# Patient Record
Sex: Female | Born: 1990 | Race: White | Hispanic: Yes | Marital: Married | Smoking: Never smoker
Health system: Southern US, Community
[De-identification: ages and names within clinical notes are randomized; demographics above are authoritative.]

## PROBLEM LIST (undated history)

## (undated) HISTORY — PX: EYE SURGERY: SHX253

---

## 2019-06-27 ENCOUNTER — Encounter
Admission: EM | Disposition: A | Discharge status: Home / Self Care | Payer: Self-pay | Source: Home / Self Care | Attending: Student in an Organized Health Care Education/Training Program

## 2019-06-27 ENCOUNTER — Emergency Department: Payer: Self-pay | Admitting: Anesthesiology

## 2019-06-27 ENCOUNTER — Observation Stay
Admission: EM | Admit: 2019-06-27 | Discharge: 2019-06-28 | Disposition: A | Discharge status: Home / Self Care | Payer: Self-pay | Attending: Obstetrics and Gynecology | Admitting: Obstetrics and Gynecology

## 2019-06-27 ENCOUNTER — Inpatient Hospital Stay: Admit: 2019-06-27 | Payer: Self-pay | Admitting: Obstetrics and Gynecology

## 2019-06-27 ENCOUNTER — Emergency Department: Payer: Self-pay

## 2019-06-27 ENCOUNTER — Other Ambulatory Visit: Payer: Self-pay

## 2019-06-27 DIAGNOSIS — D270 Benign neoplasm of right ovary: Secondary | ICD-10-CM | POA: Insufficient documentation

## 2019-06-27 DIAGNOSIS — N838 Other noninflammatory disorders of ovary, fallopian tube and broad ligament: Secondary | ICD-10-CM | POA: Insufficient documentation

## 2019-06-27 DIAGNOSIS — R102 Pelvic and perineal pain: Secondary | ICD-10-CM

## 2019-06-27 DIAGNOSIS — Z20822 Contact with and (suspected) exposure to covid-19: Secondary | ICD-10-CM | POA: Insufficient documentation

## 2019-06-27 DIAGNOSIS — N83519 Torsion of ovary and ovarian pedicle, unspecified side: Secondary | ICD-10-CM

## 2019-06-27 DIAGNOSIS — R1032 Left lower quadrant pain: Secondary | ICD-10-CM

## 2019-06-27 DIAGNOSIS — Z9889 Other specified postprocedural states: Secondary | ICD-10-CM

## 2019-06-27 DIAGNOSIS — N83292 Other ovarian cyst, left side: Secondary | ICD-10-CM | POA: Insufficient documentation

## 2019-06-27 DIAGNOSIS — R112 Nausea with vomiting, unspecified: Secondary | ICD-10-CM

## 2019-06-27 DIAGNOSIS — N83512 Torsion of left ovary and ovarian pedicle: Principal | ICD-10-CM | POA: Insufficient documentation

## 2019-06-27 HISTORY — PX: LAPAROSCOPIC OVARIAN CYSTECTOMY: SHX6248

## 2019-06-27 HISTORY — PX: LAPAROSCOPY: SHX197

## 2019-06-27 LAB — URINALYSIS, COMPLETE (UACMP) WITH MICROSCOPIC
Bacteria, UA: NONE SEEN
Bilirubin Urine: NEGATIVE
Glucose, UA: NEGATIVE mg/dL
Ketones, ur: NEGATIVE mg/dL
Nitrite: NEGATIVE
Protein, ur: NEGATIVE mg/dL
Specific Gravity, Urine: 1.011 (ref 1.005–1.030)
pH: 7 (ref 5.0–8.0)

## 2019-06-27 LAB — BASIC METABOLIC PANEL
Anion gap: 10 (ref 5–15)
BUN: 10 mg/dL (ref 6–20)
CO2: 29 mmol/L (ref 22–32)
Calcium: 9.1 mg/dL (ref 8.9–10.3)
Chloride: 100 mmol/L (ref 98–111)
Creatinine, Ser: 0.55 mg/dL (ref 0.44–1.00)
GFR calc Af Amer: >60 mL/min (ref >60)
GFR calc non Af Amer: >60 mL/min (ref >60)
Glucose, Bld: 121 mg/dL — ABNORMAL HIGH (ref 70–99)
Potassium: 3.4 mmol/L — ABNORMAL LOW (ref 3.5–5.1)
Sodium: 139 mmol/L (ref 135–145)

## 2019-06-27 LAB — CBC
HCT: 40.2 % (ref 36.0–46.0)
Hemoglobin: 13.9 g/dL (ref 12.0–15.0)
MCH: 30.4 pg (ref 26.0–34.0)
MCHC: 34.6 g/dL (ref 30.0–36.0)
MCV: 88 fL (ref 80.0–100.0)
Platelets: 309 10*3/uL (ref 150–400)
RBC: 4.57 MIL/uL (ref 3.87–5.11)
RDW: 12 % (ref 11.5–15.5)
WBC: 8.8 10*3/uL (ref 4.0–10.5)
nRBC: 0 % (ref 0.0–0.2)

## 2019-06-27 LAB — POC SARS CORONAVIRUS 2 AG: SARS Coronavirus 2 Ag: NEGATIVE

## 2019-06-27 LAB — RESPIRATORY PANEL BY RT PCR (FLU A&B, COVID)
Influenza A by PCR: NEGATIVE
Influenza B by PCR: NEGATIVE
SARS Coronavirus 2 by RT PCR: NEGATIVE

## 2019-06-27 LAB — POCT PREGNANCY, URINE: Preg Test, Ur: NEGATIVE

## 2019-06-27 LAB — PROTIME-INR
INR: 0.9 (ref 0.8–1.2)
Prothrombin Time: 12.4 seconds (ref 11.4–15.2)

## 2019-06-27 SURGERY — LAPAROSCOPY, DIAGNOSTIC
Anesthesia: General

## 2019-06-27 MED ORDER — ONDANSETRON HCL 4 MG/2ML IJ SOLN
INTRAMUSCULAR | Status: DC | PRN
  Administered 2019-06-27: 4 mg via INTRAVENOUS

## 2019-06-27 MED ORDER — KETOROLAC TROMETHAMINE 30 MG/ML IJ SOLN
30.0000 mg | Freq: Once | INTRAMUSCULAR | Status: AC
  Administered 2019-06-27: 30 mg via INTRAMUSCULAR
  Filled 2019-06-27: qty 1

## 2019-06-27 MED ORDER — BUPIVACAINE HCL 0.5 % IJ SOLN
INTRAMUSCULAR | Status: DC | PRN
  Administered 2019-06-27: 8 mL

## 2019-06-27 MED ORDER — PROPOFOL 10 MG/ML IV BOLUS
INTRAVENOUS | Status: DC | PRN
  Administered 2019-06-27: 150 mg via INTRAVENOUS

## 2019-06-27 MED ORDER — PROMETHAZINE HCL 25 MG/ML IJ SOLN: 6.2500 mg | INTRAMUSCULAR | Status: DC | PRN

## 2019-06-27 MED ORDER — ROCURONIUM BROMIDE 100 MG/10ML IV SOLN
INTRAVENOUS | Status: DC | PRN
  Administered 2019-06-27: 15 mg via INTRAVENOUS
  Administered 2019-06-27: 40 mg via INTRAVENOUS

## 2019-06-27 MED ORDER — DEXMEDETOMIDINE HCL 200 MCG/2ML IV SOLN
INTRAVENOUS | Status: DC | PRN
  Administered 2019-06-27: 12 ug via INTRAVENOUS

## 2019-06-27 MED ORDER — DEXAMETHASONE SODIUM PHOSPHATE 10 MG/ML IJ SOLN
INTRAMUSCULAR | Status: DC | PRN
  Administered 2019-06-27: 10 mg via INTRAVENOUS

## 2019-06-27 MED ORDER — FENTANYL CITRATE (PF) 100 MCG/2ML IJ SOLN
25.0000 ug | INTRAMUSCULAR | Status: DC | PRN
  Administered 2019-06-27: 25 ug via INTRAVENOUS

## 2019-06-27 MED ORDER — OXYCODONE HCL 5 MG/5ML PO SOLN: 5.0000 mg | Freq: Once | ORAL | Status: DC | PRN

## 2019-06-27 MED ORDER — KETOROLAC TROMETHAMINE 30 MG/ML IJ SOLN
INTRAMUSCULAR | Status: DC | PRN
  Administered 2019-06-27: 30 mg via INTRAVENOUS

## 2019-06-27 MED ORDER — ONDANSETRON 4 MG PO TBDP: 4.0000 mg | ORAL_TABLET | Freq: Four times a day (QID) | ORAL | Status: DC | PRN

## 2019-06-27 MED ORDER — OXYCODONE HCL 5 MG PO TABS: 5.0000 mg | ORAL_TABLET | Freq: Once | ORAL | Status: DC | PRN

## 2019-06-27 MED ORDER — SUCCINYLCHOLINE CHLORIDE 20 MG/ML IJ SOLN
INTRAMUSCULAR | Status: DC | PRN
  Administered 2019-06-27: 160 mg via INTRAVENOUS

## 2019-06-27 MED ORDER — LACTATED RINGERS IV SOLN: INTRAVENOUS | Status: DC

## 2019-06-27 MED ORDER — FENTANYL CITRATE (PF) 100 MCG/2ML IJ SOLN
INTRAMUSCULAR | Status: DC | PRN
  Administered 2019-06-27 (×2): 50 ug via INTRAVENOUS

## 2019-06-27 MED ORDER — OXYCODONE-ACETAMINOPHEN 5-325 MG PO TABS
1.0000 | ORAL_TABLET | ORAL | Status: DC | PRN
  Administered 2019-06-27 – 2019-06-28 (×2): 1 via ORAL
  Filled 2019-06-27 (×2): qty 1

## 2019-06-27 MED ORDER — MIDAZOLAM HCL 2 MG/2ML IJ SOLN
INTRAMUSCULAR | Status: DC | PRN
  Administered 2019-06-27: 2 mg via INTRAVENOUS

## 2019-06-27 MED ORDER — PHENYLEPHRINE HCL (PRESSORS) 10 MG/ML IV SOLN
INTRAVENOUS | Status: DC | PRN
  Administered 2019-06-27 (×2): 100 ug via INTRAVENOUS

## 2019-06-27 MED ORDER — ACETAMINOPHEN 10 MG/ML IV SOLN
INTRAVENOUS | Status: AC
  Filled 2019-06-27: qty 100

## 2019-06-27 MED ORDER — KETOROLAC TROMETHAMINE 30 MG/ML IJ SOLN: 30.0000 mg | Freq: Three times a day (TID) | INTRAMUSCULAR | Status: DC | PRN

## 2019-06-27 MED ORDER — FENTANYL CITRATE (PF) 100 MCG/2ML IJ SOLN
INTRAMUSCULAR | Status: AC
  Administered 2019-06-27: 25 ug via INTRAVENOUS
  Filled 2019-06-27: qty 2

## 2019-06-27 MED ORDER — HYDROCODONE-ACETAMINOPHEN 5-325 MG PO TABS
1.0000 | ORAL_TABLET | Freq: Once | ORAL | Status: AC
  Administered 2019-06-27: 1 via ORAL
  Filled 2019-06-27: qty 1

## 2019-06-27 MED ORDER — SUGAMMADEX SODIUM 200 MG/2ML IV SOLN
INTRAVENOUS | Status: DC | PRN
  Administered 2019-06-27: 200 mg via INTRAVENOUS

## 2019-06-27 MED ORDER — MIDAZOLAM HCL 2 MG/2ML IJ SOLN
INTRAMUSCULAR | Status: AC
  Filled 2019-06-27: qty 2

## 2019-06-27 MED ORDER — LIDOCAINE HCL (CARDIAC) PF 100 MG/5ML IV SOSY
PREFILLED_SYRINGE | INTRAVENOUS | Status: DC | PRN
  Administered 2019-06-27: 100 mg via INTRAVENOUS

## 2019-06-27 MED ORDER — FENTANYL CITRATE (PF) 100 MCG/2ML IJ SOLN
INTRAMUSCULAR | Status: AC
  Filled 2019-06-27: qty 2

## 2019-06-27 MED ORDER — LACTATED RINGERS IV SOLN: INTRAVENOUS | Status: DC | PRN

## 2019-06-27 SURGICAL SUPPLY — 42 items
BAG URINE DRAIN 2000ML AR STRL (UROLOGICAL SUPPLIES) ×3 IMPLANT
BLADE SURG SZ11 CARB STEEL (BLADE) ×3 IMPLANT
CANISTER SUCT 1200ML W/VALVE (MISCELLANEOUS) ×3 IMPLANT
CATH ROBINSON RED A/P 16FR (CATHETERS) ×3 IMPLANT
CHLORAPREP W/TINT 26 (MISCELLANEOUS) ×3 IMPLANT
CLOSURE WOUND 1/4X4 (GAUZE/BANDAGES/DRESSINGS) ×1
COVER WAND RF STERILE (DRAPES) IMPLANT
DRSG TEGADERM 2-3/8X2-3/4 SM (GAUZE/BANDAGES/DRESSINGS) ×3 IMPLANT
GLOVE BIO SURGEON STRL SZ8 (GLOVE) ×6 IMPLANT
GOWN STRL REUS W/ TWL LRG LVL3 (GOWN DISPOSABLE) ×2 IMPLANT
GOWN STRL REUS W/ TWL XL LVL3 (GOWN DISPOSABLE) ×1 IMPLANT
GOWN STRL REUS W/TWL LRG LVL3 (GOWN DISPOSABLE) ×4
GOWN STRL REUS W/TWL XL LVL3 (GOWN DISPOSABLE) ×2
GRASPER SUT TROCAR 14GX15 (MISCELLANEOUS) IMPLANT
IRRIGATION STRYKERFLOW (MISCELLANEOUS) ×1 IMPLANT
IRRIGATOR STRYKERFLOW (MISCELLANEOUS) ×3
IV NS 1000ML (IV SOLUTION)
IV NS 1000ML BAXH (IV SOLUTION) IMPLANT
KIT PINK PAD W/HEAD ARE REST (MISCELLANEOUS) ×3
KIT PINK PAD W/HEAD ARM REST (MISCELLANEOUS) ×1 IMPLANT
KIT TURNOVER CYSTO (KITS) ×3 IMPLANT
KITTNER LAPARASCOPIC 5X40 (MISCELLANEOUS) ×3 IMPLANT
LABEL OR SOLS (LABEL) ×3 IMPLANT
NS IRRIG 500ML POUR BTL (IV SOLUTION) ×3 IMPLANT
PACK GYN LAPAROSCOPIC (MISCELLANEOUS) ×3 IMPLANT
PAD OB MATERNITY 4.3X12.25 (PERSONAL CARE ITEMS) ×3 IMPLANT
PAD PREP 24X41 OB/GYN DISP (PERSONAL CARE ITEMS) ×3 IMPLANT
POUCH SPECIMEN RETRIEVAL 10MM (ENDOMECHANICALS) ×3 IMPLANT
SCISSORS METZENBAUM CVD 33 (INSTRUMENTS) ×3 IMPLANT
SET TUBE SMOKE EVAC HIGH FLOW (TUBING) ×3 IMPLANT
SHEARS HARMONIC ACE PLUS 36CM (ENDOMECHANICALS) ×3 IMPLANT
SLEEVE ENDOPATH XCEL 5M (ENDOMECHANICALS) ×3 IMPLANT
SPONGE GAUZE 2X2 8PLY STER LF (GAUZE/BANDAGES/DRESSINGS) ×1
SPONGE GAUZE 2X2 8PLY STRL LF (GAUZE/BANDAGES/DRESSINGS) ×2 IMPLANT
STRIP CLOSURE SKIN 1/4X4 (GAUZE/BANDAGES/DRESSINGS) ×2 IMPLANT
SUT VIC AB 0 CT1 36 (SUTURE) ×3 IMPLANT
SUT VIC AB 2-0 UR6 27 (SUTURE) ×3 IMPLANT
SUT VIC AB 4-0 SH 27 (SUTURE) ×2
SUT VIC AB 4-0 SH 27XANBCTRL (SUTURE) ×1 IMPLANT
SWABSTK COMLB BENZOIN TINCTURE (MISCELLANEOUS) ×3 IMPLANT
TROCAR ENDO BLADELESS 11MM (ENDOMECHANICALS) ×3 IMPLANT
TROCAR XCEL NON-BLD 5MMX100MML (ENDOMECHANICALS) ×3 IMPLANT

## 2019-06-27 NOTE — Plan of Care (Signed)
Transferred to Room 343 from PACU;Bedside Report Received from PACU RN. Video Interpreter used for all Assessment, Nursing Hx, High Fall Risk and Prevention and all Education. Interpreter: Caren Griffins: # [id number]

## 2019-06-27 NOTE — Consult Note (Signed)
Consult History and Physical   SERVICE: Gynecology unassigned patient  With consult from ED NP Webb Silversmith   Patient Name: Terri Brooks [facility] Patient MRN:   [id number]  CC:  Unassigned   HPI: [name] is a 29 y.o. No obstetric history on file. with 2 hour history of LLQ / Pelvic pain that came on suddenly . Go  Not on contraception . Pain 10+/ 10  No prior h/o similar  CT scan and U/S show enlarged simple bilateral ovarian cyst . Possible torsion on left    Review of Systems: positives in bold GEN:   fevers, chills, weight changes, appetite changes, fatigue, night sweats HEENT:  HA, vision changes, hearing loss, congestion, rhinorrhea, sinus pressure, dysphagia CV:   CP, palpitations PULM:  SOB, cough GI:  abd pain, N/V/D/C GU:  dysuria, urgency, frequency, + left pelvic pain  MSK:  arthralgias, myalgias, back pain, swelling SKIN:  rashes, color changes, pallor NEURO:  numbness, weakness, tingling, seizures, dizziness, tremors PSYCH:  depression, anxiety, behavioral problems, confusion  HEME/LYMPH:  easy bruising or bleeding ENDO:  heat/cold intolerance  Past Obstetrical History: OB History   No obstetric history on file.     Past Gynecologic History: Patient's last menstrual period was 06/08/2019.   Past Medical History: History reviewed. No pertinent past medical history. No CVDS , No Pulm ds, No PID / STD   Past Surgical History:   Past Surgical History:  Procedure Laterality Date  . EYE SURGERY      Family History:  No Gyn Cancer  Social History:  No tobacco, no etoh   Home Medications:  Medications reconciled in EPIC  No current facility-administered medications on file prior to encounter.   No current outpatient medications on file prior to encounter.    Allergies:  No Known Allergies  Physical Exam:  Temp:  [98.1 F (36.7 C)] 98.1 F (36.7 C) (02/20 1126) Pulse Rate:  [71-74] 71 (02/20 1551) Resp:  [16-18] 16 (02/20  1551) BP: (158-164)/(96-97) 158/96 (02/20 1551) SpO2:  [97 %-98 %] 98 % (02/20 1551) Weight:  [81.6 kg] 81.6 kg (02/20 1126)   General Appearance:  Well developed, well nourished, no acute distress, alert and oriented x3 HEENT:  Normocephalic atraumatic, extraocular movements intact, moist mucous membranes Cardiovascular:  Normal S1/S2, regular rate and rhythm, no murmurs Pulmonary:  clear to auscultation, no wheezes, rales or rhonchi, symmetric air entry, good air exchange Abdomen:  Bowel sounds present, soft, 1+/ 4 LLQ TTP  No rebound  Extremities:  Full range of motion, no pedal edema, 2+ distal pulses, no tenderness Skin:  normal coloration and turgor, no rashes, no suspicious skin lesions noted  Neurologic:  Cranial nerves 2-12 grossly intact, normal muscle tone, strength 5/5 all four extremities Psychiatric:  Normal mood and affect, appropriate, no AH/VH Pelvic:  Deferred   Labs/Studies:  Neg HCG  CBC and Coags:  Lab Results  Component Value Date   WBC 8.8 06/27/2019   HGB 13.9 06/27/2019   HCT 40.2 06/27/2019   MCV 88.0 06/27/2019   PLT 309 06/27/2019   INR 0.9 06/27/2019   CMP:  Lab Results  Component Value Date   NA 139 06/27/2019   K 3.4 (L) 06/27/2019   CL 100 06/27/2019   CO2 29 06/27/2019   BUN 10 06/27/2019   CREATININE 0.55 06/27/2019    Other Imaging: CT Renal Stone Study  Result Date: 06/27/2019 CLINICAL DATA:  Left lower quadrant abdominal pain. Nausea and vomiting. EXAM: CT ABDOMEN  AND PELVIS WITHOUT CONTRAST TECHNIQUE: Multidetector CT imaging of the abdomen and pelvis was performed following the standard protocol without IV contrast. COMPARISON:  None. FINDINGS: Lower chest:  Calcified granuloma at the right middle lobe. Hepatobiliary: Hepatic steatosis with subtle fat sparing at the gallbladder fossa.No evidence of biliary obstruction or stone. Pancreas: Unremarkable. Spleen: Unremarkable. Adrenals/Urinary Tract: Negative adrenals. No hydronephrosis or  stone. Unremarkable bladder. Stomach/Bowel:  No obstruction. No appendicitis. Vascular/Lymphatic: No acute vascular abnormality. No mass or adenopathy. Reproductive:Large appearance of both ovaries, but greater on the left with a low-density appearance and measuring 7.2 cm. There is septated or multiple cysts on the left and a 10 cm by 13 cm cyst on the right. The vascular pedicle on the left appears somewhat redundant, findings overall worrisome for torsion. Other: No ascites or pneumoperitoneum. Musculoskeletal: No acute abnormalities. These results were called by telephone at the time of interpretation on 06/27/2019 at 1:54 pm to provider [facility] , who verbally acknowledged these results. IMPRESSION: 1. Low-density thickening of the left ovary with distorted vascular pedicle and septated or multiple cysts/. Recommend pelvic ultrasound with Doppler to evaluate for torsion, the leading concern. 2. 13 cm right ovarian cyst that also needs sonographic assessment. 3. Paddock steatosis. Electronically Signed   By: Monte Fantasia M.D.   On: 06/27/2019 13:56   US PELVIC COMPLETE W TRANSVAGINAL AND TORSION R/O  Result Date: 06/27/2019 CLINICAL DATA:  Pelvic pain, concern for ovarian torsion. LMP 06/08/2019. EXAM: TRANSABDOMINAL AND TRANSVAGINAL ULTRASOUND OF PELVIS DOPPLER ULTRASOUND OF OVARIES TECHNIQUE: Both transabdominal and transvaginal ultrasound examinations of the pelvis were performed. Transabdominal technique was performed for global imaging of the pelvis including uterus, ovaries, adnexal regions, and pelvic cul-de-sac. It was necessary to proceed with endovaginal exam following the transabdominal exam to visualize the ovaries. Color and duplex Doppler ultrasound was utilized to evaluate blood flow to the ovaries. COMPARISON:  CT abdomen pelvis performed the same day FINDINGS: Uterus Measurements: 7.1 x 3.5 x 4.7 cm = volume: 61 mL. No fibroids or other mass visualized. Endometrium Thickness: 9 mm  which is likely normal given the patient's phase of the menstrual cycle. No focal abnormality visualized. Right ovary Measurements: 4.1 x 3.1 x 3.8 cm = volume: 25 mL. A large simple cyst measures 12.8 x 9.7 x 10.5 cm. Left ovary Measurements: 6.9 x 4.6 x 5.8 cm = volume: 96 mL. Two large adjacent cysts in total measure 13.3 x 7.1 x 6.0 cm. The central stroma of the ovary is hyperechoic and there are peripherally displaced follicles. Pulsed Doppler evaluation of both ovaries demonstrates normal low-resistance arterial waveforms. Normal venous waveforms are seen in the right ovary. Other findings No abnormal free fluid. IMPRESSION: Enlarged left ovary with hyperechoic central stroma is suspicious for ovarian torsion. These results were called by telephone at the time of interpretation on 06/27/2019 at 4:19 pm to provider Webb Silversmith NP, who verbally acknowledged these results. Electronically Signed   By: Zerita Boers M.D.   On: 06/27/2019 16:21     Assessment / Plan:   MARIELY CRISTELA Tullier is a 29 y.o. with symptomatic bilateral  ovarian cyst . 2 left ( simple ) and one right ( also Simple). Possible torsion on the left   Via the translator I have spelled out the need for surgery to possible de torse the left ovary . Ovarian cystectomies / cystotomies and then placement on OCPs to prevent redevelopment of cysts The risk of not doing surgery with torsion resulting in continued  pain and loss of left ovary   I have spelled out the risks of the surgery and the possibility of loss of either ovary while performing surgery .  pt's husband has also been counseled . They are in agreement and consents have been signed 75 minutes in patient care

## 2019-06-27 NOTE — Anesthesia Preprocedure Evaluation (Addendum)
Anesthesia Evaluation  Patient identified by MRN, date of birth, ID band Patient awake    Reviewed: Allergy & Precautions, H&P , NPO status , Patient's Chart, lab work & pertinent test results  History of Anesthesia Complications Negative for: history of anesthetic complications  Airway Mallampati: III  TM Distance: <3 FB Neck ROM: full    Dental  (+) Chipped, Poor Dentition   Pulmonary shortness of breath,           Cardiovascular (-) angina(-) Past MI negative cardio ROS       Neuro/Psych negative neurological ROS  negative psych ROS   GI/Hepatic negative GI ROS, Neg liver ROS, neg GERD  ,  Endo/Other  negative endocrine ROS  Renal/GU      Musculoskeletal   Abdominal   Peds  Hematology negative hematology ROS (+)   Anesthesia Other Findings History reviewed. No pertinent past medical history.  Past Surgical History: No date: EYE SURGERY  BMI    Body Mass Index: 32.92 kg/m      Reproductive/Obstetrics negative OB ROS                            Anesthesia Physical Anesthesia Plan  ASA: II  Anesthesia Plan: General ETT   Post-op Pain Management:    Induction: Intravenous  PONV Risk Score and Plan: Ondansetron, Dexamethasone, Midazolam and Treatment may vary due to age or medical condition  Airway Management Planned: Oral ETT  Additional Equipment:   Intra-op Plan:   Post-operative Plan: Extubation in OR  Informed Consent: I have reviewed the patients History and Physical, chart, labs and discussed the procedure including the risks, benefits and alternatives for the proposed anesthesia with the patient or authorized representative who has indicated his/her understanding and acceptance.     Dental Advisory Given  Plan Discussed with: Anesthesiologist, CRNA and Surgeon  Anesthesia Plan Comments: (History and consent via interpreter   Patient consented for risks  of anesthesia including but not limited to:  - adverse reactions to medications - damage to teeth, lips or other oral mucosa - sore throat or hoarseness - Damage to heart, brain, lungs or loss of life  Patient voiced understanding.)        Anesthesia Quick Evaluation

## 2019-06-27 NOTE — ED Provider Notes (Addendum)
[facility] Emergency Department Provider Note ____________________________________________  Time seen: 1235  I have reviewed the triage vital signs and the nursing notes.  HISTORY  Chief Complaint  Flank Pain   HPI Terri Brooks is a 29 y.o. female presents to the clinic today with c/o left lower quadrant pain. She reports the pain started this morning. The pain is constant but has gotten worse throughout the day. She is unable to describe the pain but reports the pain makes her have the urge to urinate. The pain does radiate into her groin. She has had some nausea and vomiting today. She denies constipation, diarrhea or blood in her stool. She denies urinary or vaginal complaints. Her LMP was the end of January into the beginning of February. She denies rash, fever, chills or body aches. She has not taken anything OTC for this. She has no history of kidney stones, no previous UTI's in the past.  History reviewed. No pertinent past medical history.  There are no problems to display for this patient.   Past Surgical History:  Procedure Laterality Date  . EYE SURGERY      Prior to Admission medications   Not on File    Allergies Patient has no known allergies.  History reviewed. No pertinent family history.  Social History Social History   Tobacco Use  . Smoking status: Never Smoker  Substance Use Topics  . Alcohol use: Not Currently  . Drug use: Not on file    Review of Systems  Constitutional: Negative for fever, chills or body aches. Cardiovascular: Negative for chest pain or chest tightness. Respiratory: Negative for cough or shortness of breath. Gastrointestinal: Positive for abdominal pain, nausea, and vomiting. Negative for constipation, diarrhea or blood in the stool. Genitourinary: Negative for urgency, frequency, dysuria or blood in the urine.. Musculoskeletal: Negative for back pain. Skin: Negative for rash. Neurological:  Negative for headaches, focal weakness, tingling or numbness. ____________________________________________  PHYSICAL EXAM:  VITAL SIGNS: ED Triage Vitals [06/27/19 1126]  Enc Vitals Group     BP (!) 164/97     Pulse Rate 74     Resp 18     Temp 98.1 F (36.7 C)     Temp Source Oral     SpO2 97 %     Weight 180 lb (81.6 kg)     Height 5\' 2"  (1.575 m)     Head Circumference      Peak Flow      Pain Score 2     Pain Loc      Pain Edu?      Excl. in [place]?     Constitutional: Alert and oriented. Obese, appears in pain but in NAD. Cardiovascular: Normal rate, regular rhythm. No murmur noted. Respiratory: Tachypneic. No wheezes/rales/rhonchi. Gastrointestinal: Soft, tender in the LLQ. No distention or masses noted. Active bowel sounds. No CVA tenderness noted. Musculoskeletal: No tenderness noted over the lumbar spine. Neurologic:  Normal gait without ataxia. Normal speech and language. No gross focal neurologic deficits are appreciated. Skin:  Skin is warm, dry and intact. No rash noted. Psychiatric: Anxious appearing. ____________________________________________   LABS  Recent Results (from the past 2160 hour(s))  Urinalysis, Complete w Microscopic     Status: Abnormal   Collection Time: 06/27/19 11:35 AM  Result Value Ref Range   Color, Urine YELLOW YELLOW   APPearance CLOUDY (A) CLEAR   Specific Gravity, Urine 1.011 1.005 - 1.030   pH 7.0 5.0 - 8.0  Glucose, UA NEGATIVE NEGATIVE mg/dL   Hgb urine dipstick SMALL (A) NEGATIVE   Bilirubin Urine NEGATIVE NEGATIVE   Ketones, ur NEGATIVE NEGATIVE mg/dL   Protein, ur NEGATIVE NEGATIVE mg/dL   Nitrite NEGATIVE NEGATIVE   Leukocytes,Ua MODERATE (A) NEGATIVE   RBC / HPF 21-50 0 - 5 RBC/hpf   WBC, UA 11-20 0 - 5 WBC/hpf   Bacteria, UA NONE SEEN NONE SEEN   Squamous Epithelial / LPF 0-5 0 - 5   Mucus PRESENT    Sperm, UA PRESENT     Comment: Performed at [facility], [address] [place], [place] [id number]   Basic metabolic panel     Status: Abnormal   Collection Time: 06/27/19 11:35 AM  Result Value Ref Range   Sodium 139 135 - 145 mmol/L   Potassium 3.4 (L) 3.5 - 5.1 mmol/L   Chloride 100 98 - 111 mmol/L   CO2 29 22 - 32 mmol/L   Glucose, Bld 121 (H) 70 - 99 mg/dL   BUN 10 6 - 20 mg/dL   Creatinine, Ser 0.55 0.44 - 1.00 mg/dL   Calcium 9.1 8.9 - 10.3 mg/dL   GFR calc non Af Amer >60 >60 mL/min   GFR calc Af Amer >60 >60 mL/min   Anion gap 10 5 - 15    Comment: Performed at [facility], [address] [place], [place] [address]  CBC     Status: None   Collection Time: 06/27/19 11:35 AM  Result Value Ref Range   WBC 8.8 4.0 - 10.5 K/uL   RBC 4.57 3.87 - 5.11 MIL/uL   Hemoglobin 13.9 12.0 - 15.0 g/dL   HCT 40.2 36.0 - 46.0 %   MCV 88.0 80.0 - 100.0 fL   MCH 30.4 26.0 - 34.0 pg   MCHC 34.6 30.0 - 36.0 g/dL   RDW 12.0 11.5 - 15.5 %   Platelets 309 150 - 400 K/uL   nRBC 0.0 0.0 - 0.2 %    Comment: Performed at [facility], [address] [place], [place] [address]  Pregnancy, urine POC     Status: None   Collection Time: 06/27/19 11:43 AM  Result Value Ref Range   Preg Test, Ur NEGATIVE NEGATIVE    Comment:        THE SENSITIVITY OF THIS METHODOLOGY IS >24 mIU/mL   Protime-INR     Status: None   Collection Time: 06/27/19  2:11 PM  Result Value Ref Range   Prothrombin Time 12.4 11.4 - 15.2 seconds   INR 0.9 0.8 - 1.2    Comment: (NOTE) INR goal varies based on device and disease states. Performed at [facility], [place], [place] [address]   POC SARS Coronavirus 2 Ag     Status: None   Collection Time: 06/27/19  6:08 PM  Result Value Ref Range   SARS Coronavirus 2 Ag NEGATIVE NEGATIVE    Comment: (NOTE) SARS-CoV-2 antigen NOT DETECTED.  Negative results are presumptive.  Negative results do not preclude SARS-CoV-2 infection and should not be used as the sole basis for treatment or other patient management decisions,  including infection  control decisions, particularly in the presence of clinical signs and  symptoms consistent with COVID-19, or in those who have been in contact with the virus.  Negative results must be combined with clinical observations, patient history, and epidemiological information. The expected result is Negative. Fact Sheet for Patients: PodPark.tn Fact Sheet for Healthcare  Providers: GiftContent.is This test is not yet approved or cleared by the [place] and  has been authorized for detection and/or diagnosis of SARS-CoV-2 by FDA under an Emergency Use Authorization (EUA).  This EUA will remain in effect (meaning this test can be used) for the duration of  the COVID-19 de claration under Section 564(b)(1) of the Act, 21 U.S.C. section 360bbb-3(b)(1), unless the authorization is terminated or revoked sooner.     ____________________________________________  RADIOLOGY   Imaging Orders     CT Renal Stone Study     US PELVIC COMPLETE W TRANSVAGINAL AND TORSION R/O  ____ IMPRESSION:  1. Low-density thickening of the left ovary with distorted vascular  pedicle and septated or multiple cysts/. Recommend pelvic ultrasound  with Doppler to evaluate for torsion, the leading concern.  2. 13 cm right ovarian cyst that also needs sonographic assessment.  3. Paddock steatosis.   IMPRESSION:  Enlarged left ovary with hyperechoic central stroma is suspicious  for ovarian torsion.    ________________________________________   INITIAL IMPRESSION / ASSESSMENT AND PLAN / ED COURSE  LLQ Pain, Nausea and Vomiting:  DDX include ovarian cyst, UTI, kidney stone, ovarian torsion Urinalysis concerning for UTI vs kidney stone CT renal stone study ordered- call reports from radiology at 1345 Toradol 30 mg IM x 1 GYN paged- Dr. Ouida Sills in to evaluate the patient at 1650 Hydrocodone 5-325 mg PO x 1 NPO Tier 2  Covid test ordered Dr. Ouida Sills plans to take pt to surgery   I reviewed the patient's prescription history over the last 12 months in the multi-state controlled substances database(s) that includes [place], [place], [place], [place], [place], [place], [place], [place], [place], [place], [place], [place], [place], and [place].  Results were notable for no recent controlled substances. ____________________________________________  FINAL CLINICAL IMPRESSION(S) / ED DIAGNOSES  Final diagnoses:  LLQ pain  Intractable vomiting with nausea, unspecified vomiting type  Ovarian torsion      Jearld Fenton, NP 06/27/19 Cher Nakai, MD 07/01/19 1153

## 2019-06-27 NOTE — ED Notes (Signed)
Interpreter used- pt requesting more pain medication for abdominal pain, and states IV is hurting her. Pt educated on IV maintenance/purpose and instructed to keep arm straight to help ease tenderness in [facility] area. Pt verbalizes understanding. Provider notified of pt request for pain medication.

## 2019-06-27 NOTE — ED Triage Notes (Signed)
Spanish interpreter used for triage.   States L flank pain that began this morning. Denies urinary symptoms. Denies hx of kidney stones or UTI. Some nausea, denies vomiting or diarrhea.

## 2019-06-27 NOTE — Brief Op Note (Signed)
06/27/2019  9:33 PM  PATIENT:  Terri Brooks  28 y.o. female  PRE-OPERATIVE DIAGNOSIS:  ovarian cyst, bilateral  Symptomatic . Left ovarian torsion   POST-OPERATIVE DIAGNOSIS:  Left ovarian torsion right ovarian cyst14x14 cm , left paratubal cyst 12x10 cm   PROCEDURE:  Procedure(s): LAPAROSCOPY DIAGNOSTIC (N/A) LAPAROSCOPIC OVARIAN CYSTECTOMY (N/A) Right ovarian cystectomy, left paratubal cyst  SURGEON:  Surgeon(s) and Role:    * Anavi Branscum, Gwen Her, MD - Primary  PHYSICIAN ASSISTANT: CST  ASSISTANTS: none   ANESTHESIA:   general  EBL:  5 cc, iof 1500 , ou 150 cc BLOOD ADMINISTERED:none  DRAINS: none   LOCAL MEDICATIONS USED:  MARCAINE     SPECIMEN:  Source of Specimen:  right ovarian cyst , left paratubal cyst   DISPOSITION OF SPECIMEN:  PATHOLOGY  COUNTS:  YES  TOURNIQUET:  * No tourniquets in log *  DICTATION: .Other Dictation: Dictation Number verbal  PLAN OF CARE: Admit for overnight observation  PATIENT DISPOSITION:  PACU - hemodynamically stable.   Delay start of Pharmacological VTE agent (>24hrs) due to surgical blood loss or risk of bleeding: not applicable

## 2019-06-27 NOTE — ED Notes (Signed)
See triage note  Presents with left lower abd this am  States pain is in abd and moving into groin area   Positive n/v this   States she is having some urinary sx;s  Feels like she needs to void    Unable to sit still   Stats pain is 10/10

## 2019-06-27 NOTE — Transfer of Care (Signed)
Immediate Anesthesia Transfer of Care Note  Patient: Terri Brooks  Procedure(s) Performed: LAPAROSCOPY DIAGNOSTIC (N/A ) LAPAROSCOPIC OVARIAN CYSTECTOMY (N/A )  Patient Location: PACU  Anesthesia Type:General  Level of Consciousness: sedated  Airway & Oxygen Therapy: Patient Spontanous Breathing  Post-op Assessment: Report given to RN and Post -op Vital signs reviewed and stable  Post vital signs: Reviewed and stable  Last Vitals:  Vitals Value Taken Time  BP 127/91 06/27/19 2146  Temp    Pulse 78 06/27/19 2149  Resp 18 06/27/19 2149  SpO2 99 % 06/27/19 2149  Vitals shown include unvalidated device data.  Last Pain:  Vitals:   06/27/19 2148  TempSrc:   PainSc: 4          Complications: No apparent anesthesia complications

## 2019-06-27 NOTE — Anesthesia Procedure Notes (Signed)
Procedure Name: Intubation Date/Time: 06/27/2019 7:12 PM Performed by: Andria Frames, MD Pre-anesthesia Checklist: Patient identified, Patient being monitored, Timeout performed, Emergency Drugs available and Suction available Patient Re-evaluated:Patient Re-evaluated prior to induction Oxygen Delivery Method: Circle system utilized Preoxygenation: Pre-oxygenation with 100% oxygen Induction Type: IV induction Ventilation: Mask ventilation without difficulty Laryngoscope Size: 3 and McGraph Grade View: Grade I Tube type: Oral Tube size: 7.0 mm Number of attempts: 1 Airway Equipment and Method: Stylet and Video-laryngoscopy Placement Confirmation: ETT inserted through vocal cords under direct vision,  positive ETCO2 and breath sounds checked- equal and bilateral Secured at: 21 cm Tube secured with: Tape Dental Injury: Teeth and Oropharynx as per pre-operative assessment  Difficulty Due To: Difficulty was anticipated, Difficult Airway- due to large tongue and Difficult Airway- due to anterior larynx

## 2019-06-28 NOTE — Progress Notes (Signed)
Pt discharged home. Discharge instructions, prescriptions, and follow up appointments given to and reviewed with pt. Pt verbalized understanding. To be escorted by staff.

## 2019-06-28 NOTE — Progress Notes (Signed)
   06/28/19 1050  Clinical Encounter Type  Visited With Patient and family together  Visit Type Initial  Referral From Nurse  Consult/Referral To Chaplain  Spiritual Encounters  Spiritual Needs Other (Comment)  [place] completed order requisition. Pt and husband requested information on AD. [place] educated couple through the use of the Optometrist. Pt declined need for AD directive. No further needs expressed at this time.

## 2019-06-28 NOTE — Discharge Summary (Addendum)
Physician Discharge Summary  Patient ID: Terri Brooks MRN: [id number] DOB/AGE: 1990-08-17 29 y.o.  Admit date: 06/27/2019 Discharge date: 06/28/2019  Admission Diagnoses:left pelvic pain , bilateral ovarian cyst 14 + 12 cm . Left ovarian torsion   Discharge Diagnoses:  Active Problems:   Postoperative state   Discharged Condition: good  Hospital Course: pt under went L/S left ovarian cystectomy + right paratubal cystectomy  Post DAy #1 doing well with pain in good control  Consults: None  Significant Diagnostic Studies: labs:  Results for orders placed or performed during the hospital encounter of 06/27/19 (from the past 72 hour(s))  Urinalysis, Complete w Microscopic     Status: Abnormal   Collection Time: 06/27/19 11:35 AM  Result Value Ref Range   Color, Urine YELLOW YELLOW   APPearance CLOUDY (A) CLEAR   Specific Gravity, Urine 1.011 1.005 - 1.030   pH 7.0 5.0 - 8.0   Glucose, UA NEGATIVE NEGATIVE mg/dL   Hgb urine dipstick SMALL (A) NEGATIVE   Bilirubin Urine NEGATIVE NEGATIVE   Ketones, ur NEGATIVE NEGATIVE mg/dL   Protein, ur NEGATIVE NEGATIVE mg/dL   Nitrite NEGATIVE NEGATIVE   Leukocytes,Ua MODERATE (A) NEGATIVE   RBC / HPF 21-50 0 - 5 RBC/hpf   WBC, UA 11-20 0 - 5 WBC/hpf   Bacteria, UA NONE SEEN NONE SEEN   Squamous Epithelial / LPF 0-5 0 - 5   Mucus PRESENT    Sperm, UA PRESENT     Comment: Performed at [facility], [address] [place], [place] [id number]  Basic metabolic panel     Status: Abnormal   Collection Time: 06/27/19 11:35 AM  Result Value Ref Range   Sodium 139 135 - 145 mmol/L   Potassium 3.4 (L) 3.5 - 5.1 mmol/L   Chloride 100 98 - 111 mmol/L   CO2 29 22 - 32 mmol/L   Glucose, Bld 121 (H) 70 - 99 mg/dL   BUN 10 6 - 20 mg/dL   Creatinine, Ser 0.55 0.44 - 1.00 mg/dL   Calcium 9.1 8.9 - 10.3 mg/dL   GFR calc non Af Amer >60 >60 mL/min   GFR calc Af Amer >60 >60 mL/min   Anion gap 10 5 - 15    Comment: Performed at  [facility], [address] [place], [place] [address]  CBC     Status: None   Collection Time: 06/27/19 11:35 AM  Result Value Ref Range   WBC 8.8 4.0 - 10.5 K/uL   RBC 4.57 3.87 - 5.11 MIL/uL   Hemoglobin 13.9 12.0 - 15.0 g/dL   HCT 40.2 36.0 - 46.0 %   MCV 88.0 80.0 - 100.0 fL   MCH 30.4 26.0 - 34.0 pg   MCHC 34.6 30.0 - 36.0 g/dL   RDW 12.0 11.5 - 15.5 %   Platelets 309 150 - 400 K/uL   nRBC 0.0 0.0 - 0.2 %    Comment: Performed at [facility], [address] [place], [place] [address]  Pregnancy, urine POC     Status: None   Collection Time: 06/27/19 11:43 AM  Result Value Ref Range   Preg Test, Ur NEGATIVE NEGATIVE    Comment:        THE SENSITIVITY OF THIS METHODOLOGY IS >24 mIU/mL   Protime-INR     Status: None   Collection Time: 06/27/19  2:11 PM  Result Value Ref Range   Prothrombin Time 12.4 11.4 - 15.2 seconds   INR 0.9  0.8 - 1.2    Comment: (NOTE) INR goal varies based on device and disease states. Performed at [facility], [address] [place], [place] [address]   Respiratory Panel by RT PCR (Flu A&B, Covid) - Nasopharyngeal Swab     Status: None   Collection Time: 06/27/19  5:45 PM   Specimen: Nasopharyngeal Swab  Result Value Ref Range   SARS Coronavirus 2 by RT PCR NEGATIVE NEGATIVE    Comment: (NOTE) SARS-CoV-2 target nucleic acids are NOT DETECTED. The SARS-CoV-2 RNA is generally detectable in upper respiratoy specimens during the acute phase of infection. The lowest concentration of SARS-CoV-2 viral copies this assay can detect is 131 copies/mL. A negative result does not preclude SARS-Cov-2 infection and should not be used as the sole basis for treatment or other patient management decisions. A negative result may occur with  improper specimen collection/handling, submission of specimen other than nasopharyngeal swab, presence of viral mutation(s) within the areas targeted by this assay, and inadequate number  of viral copies (<131 copies/mL). A negative result must be combined with clinical observations, patient history, and epidemiological information. The expected result is Negative. Fact Sheet for Patients:  PinkCheek.be Fact Sheet for Healthcare Providers:  GravelBags.it This test is not yet ap proved or cleared by the [place] FDA and  has been authorized for detection and/or diagnosis of SARS-CoV-2 by FDA under an Emergency Use Authorization (EUA). This EUA will remain  in effect (meaning this test can be used) for the duration of the COVID-19 declaration under Section 564(b)(1) of the Act, 21 U.S.C. section 360bbb-3(b)(1), unless the authorization is terminated or revoked sooner.    Influenza A by PCR NEGATIVE NEGATIVE   Influenza B by PCR NEGATIVE NEGATIVE    Comment: (NOTE) The Xpert Xpress SARS-CoV-2/FLU/RSV assay is intended as an aid in  the diagnosis of influenza from Nasopharyngeal swab specimens and  should not be used as a sole basis for treatment. Nasal washings and  aspirates are unacceptable for Xpert Xpress SARS-CoV-2/FLU/RSV  testing. Fact Sheet for Patients: PinkCheek.be Fact Sheet for Healthcare Providers: GravelBags.it This test is not yet approved or cleared by the [place] FDA and  has been authorized for detection and/or diagnosis of SARS-CoV-2 by  FDA under an Emergency Use Authorization (EUA). This EUA will remain  in effect (meaning this test can be used) for the duration of the  Covid-19 declaration under Section 564(b)(1) of the Act, 21  U.S.C. section 360bbb-3(b)(1), unless the authorization is  terminated or revoked. Performed at [facility], [place], [place] [address]   POC SARS Coronavirus 2 Ag     Status: None   Collection Time: 06/27/19  6:08 PM  Result Value Ref Range   SARS Coronavirus 2 Ag  NEGATIVE NEGATIVE    Comment: (NOTE) SARS-CoV-2 antigen NOT DETECTED.  Negative results are presumptive.  Negative results do not preclude SARS-CoV-2 infection and should not be used as the sole basis for treatment or other patient management decisions, including infection  control decisions, particularly in the presence of clinical signs and  symptoms consistent with COVID-19, or in those who have been in contact with the virus.  Negative results must be combined with clinical observations, patient history, and epidemiological information. The expected result is Negative. Fact Sheet for Patients: PodPark.tn Fact Sheet for Healthcare Providers: GiftContent.is This test is not yet approved or cleared by the [place] FDA and  has been authorized for detection and/or diagnosis of  SARS-CoV-2 by FDA under an Emergency Use Authorization (EUA).  This EUA will remain in effect (meaning this test can be used) for the duration of  the COVID-19 de claration under Section 564(b)(1) of the Act, 21 U.S.C. section 360bbb-3(b)(1), unless the authorization is terminated or revoked sooner.    Treatments: surgery: as above   Discharge Exam: Blood pressure 124/84, pulse 73, temperature 98.4 F (36.9 C), temperature source Oral, resp. rate 18, height 5\' 2"  (1.575 m), weight 81.6 kg, last menstrual period 06/08/2019, SpO2 98 %. General appearance: alert and cooperative Resp: clear to auscultation bilaterally GI: soft, non-tender; bowel sounds normal; no masses,  no organomegaly  Incisions c/d/i  Disposition: Discharge disposition: 01-Home or Self Care       Discharge Instructions    Call MD for:  difficulty breathing, headache or visual disturbances   Complete by: As directed    Call MD for:  extreme fatigue   Complete by: As directed    Call MD for:  hives   Complete by: As directed    Call MD for:  persistant dizziness or  light-headedness   Complete by: As directed    Call MD for:  persistant nausea and vomiting   Complete by: As directed    Call MD for:  redness, tenderness, or signs of infection (pain, swelling, redness, odor or green/yellow discharge around incision site)   Complete by: As directed    Call MD for:  severe uncontrolled pain   Complete by: As directed    Call MD for:  temperature >100.4   Complete by: As directed    Diet - low sodium heart healthy   Complete by: As directed    Increase activity slowly   Complete by: As directed      Allergies as of 06/28/2019   No Known Allergies     Medication List    You have not been prescribed any medications.   hard copy RX : percocet 1 po q 4-6 hrs prn pain                            zofran 8 mg q 8 hrs prn N/V                           Motrin 800 mg q 8 hrs prn pain                           necon 1/35  1 po qd  Follow-up Information    Kateline Kinkade, Gwen Her, MD. Schedule an appointment as soon as possible for a visit in 2 week(s).   Specialty: Obstetrics and Gynecology Why: call and make 2 week follow up  Contact information: [address] [place] [address] [phone]           Signed: Gwen Her Jaonna Word 06/28/2019, 10:29 AM

## 2019-06-28 NOTE — Anesthesia Postprocedure Evaluation (Signed)
Anesthesia Post Note  Patient: Estate manager/land agent  Procedure(s) Performed: LAPAROSCOPY DIAGNOSTIC (N/A ) LAPAROSCOPIC OVARIAN CYSTECTOMY (N/A )  Patient location during evaluation: PACU Anesthesia Type: General Level of consciousness: awake and alert Pain management: pain level controlled Vital Signs Assessment: post-procedure vital signs reviewed and stable Respiratory status: spontaneous breathing, nonlabored ventilation, respiratory function stable and patient connected to nasal cannula oxygen Cardiovascular status: blood pressure returned to baseline and stable Postop Assessment: no apparent nausea or vomiting Anesthetic complications: no     Last Vitals:  Vitals:   06/27/19 2345 06/28/19 0316  BP: 124/83 130/74  Pulse: 71 78  Resp: 20 20  Temp: 36.6 C 36.5 C  SpO2: 99% 98%    Last Pain:  Vitals:   06/28/19 0316  TempSrc: Oral  PainSc:                  Precious Haws Marializ Ferrebee

## 2019-06-28 NOTE — Progress Notes (Signed)
Pt. Pulled IV out. Site is without s/s complication. Catheter intact. Dr. Ouida Sills notified and IV and IVF D/C'd as per verbal order.

## 2019-06-29 NOTE — Op Note (Signed)
NAME: Terri Brooks, Terri Brooks MEDICAL RECORD [id number] ACCOUNT [id number] DATE OF BIRTH:05-Nov-1990 FACILITY: [facility] LOCATION: [facility] PHYSICIAN:Anetria Harwick Josefine Class, MD  OPERATIVE REPORT  DATE OF PROCEDURE:  06/27/2019  PREOPERATIVE DIAGNOSES:   1.  Bilateral symptomatic ovarian cyst. 2.  Left ovarian torsion.  POSTOPERATIVE DIAGNOSES: 1.  Symptomatic bilateral ovarian cysts. 2.  Left ovarian torsion. 3.  Right ovarian cyst, 14 x 12 cm. 4.  Left paratubal cyst, 12 x 10 cm.  PROCEDURE: 1.  Laparoscopic detorsion of the left ovary.  2.  Left paratubal cystectomy.   3.  Right ovarian cystectomy.  ANESTHESIA:  General endotracheal anesthesia.  SURGEON:  Laverta Baltimore, MD  FIRST ASSISTANT:  Certified Scrub Tech  INDICATIONS:  A 29 year old patient admitted to the Emergency Department on 06/27/2019 after developing sudden onset of left pelvic pain.  The patient underwent a CT scan and a vaginal ultrasound that documented bilateral ovarian cysts and possible left  ovarian torsion.  DESCRIPTION OF PROCEDURE:  After adequate general endotracheal anesthesia, the patient was placed in dorsal supine position, legs in the Excello stirrups.  The patient's abdomen, perineum and vagina were prepped and draped in normal sterile fashion.   Timeout was performed.  The patient's bladder was drained, yielding 150 mL of clear urine.  A sponge stick was placed in the vagina to be used for uterine manipulation during the procedure.  Gloves were changed.  Attention was directed to the patient's  abdomen.  A 5 mm infraumbilical incision was made after injecting with 0.5% Marcaine.  A 5 mm laparoscope was advanced into the abdominal cavity under direct visualization with the Optiview cannula.  Initial impression revealed bilateral large ovarian  cysts.  Second port site was placed in the left lower quadrant, 3 cm medial to the left anterior iliac spine; under direct visualization, a 5 mm trocar  was advanced.  A third port site was placed in the right lower quadrant, again 3 cm medial to the  right anterior iliac spine.  Attention directed to the patient's left adnexa revealed ovarian torsion with the adnexa twisted approximately 500 degrees.  The structures were detorsed.  Attention was directed to the patient's right ovarian cyst, which  was estimated at 14 x 12 cm, blue domed.  Harmonic scalpel was brought up and an incision was made into the cyst wall and drained.  Approximately 450 mL of fluid drained from the right ovarian cyst.  The cyst wall was then grasped with [place] graspers  followed by countertraction with graspers to the ovarian cortex and meticulous traction and countertraction and dissection and ultimately removed the large ovarian cyst intact.  Of note, the fallopian tube at the fimbriated end was extremely attenuated  due to the pressure from the large ovarian cyst and the fimbriated end could not be visualized.  Attention was then directed to the patient's left adnexa again, where the large paratubal cyst running the length of the mesosalpinx was noted.  The  fallopian tube was draped on top; fimbriated end was visualized.  Harmonic scalpel was used to open the cyst and additional 350 mL of fluid was removed.  The cyst measured 12 x 10, approximately.  Cyst wall was opened additionally and the cyst wall was  then grasped with the [place] graspers with traction and countertraction.  Again, the cyst was removed intact with good hemostasis noted.  The left ovary appeared hemorrhagic, consistent with a recent torsion.  The left adnexa was somewhat floppy, but  the thought was that with  decrease in the paratubal cyst, the ovary would be less likely to re-torse.  The uterus appeared normal.  Upper abdomen appeared normal as well.  Good hemostasis was noted.  An Endobag was brought through the left lower port  site after placing a 10 mm trocar.  The 2 cysts were removed together  through the left lower port site and each cyst will be separated and sent to pathology for identification.  The patient's abdomen was suctioned for additional fluid.  Again, good  hemostasis was noted.  The left port site's fascia was closed with the cone apparatus and the PMI with good closure of the fascia.  The patient's abdomen was deflated and all port sites were closed with interrupted 4-0 Vicryl suture.  A sterile dressing  applied.  The sponge stick was then removed from the vagina and the case was terminated.  ESTIMATED BLOOD LOSS:  5 mL.  TOTAL CYST FLUID REMOVED:  800 mL.  INTRAOPERATIVE FLUIDS:  100 mL.  URINE OUTPUT:  150 mL.  DISPOSITION:  The patient tolerated the procedure well and was taken to recovery room in good condition.  JN/NUANCE  D:06/27/2019 T:06/29/2019 JOB:[id number]

## 2019-06-30 LAB — SURGICAL PATHOLOGY

## 2021-08-14 IMAGING — CT CT RENAL STONE PROTOCOL
3 of 4 series · 9 of 46 positions shown, 16 images · non-contrast
Comparison: None.

CLINICAL DATA: Left lower quadrant abdominal pain. Nausea and
vomiting.

EXAM:
CT ABDOMEN AND PELVIS WITHOUT CONTRAST
TECHNIQUE: Multidetector CT imaging of the abdomen and pelvis was performed
following the standard protocol without IV contrast.

[Series 4: lung bases · axial · 0.63mm/px · z∈[-148,-48]mm · 5 of 30 slices shown, 10 images]
[im 5/30  soft-tissue]
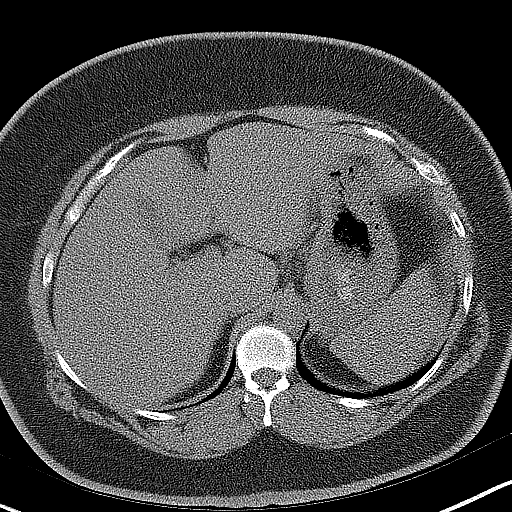
[im 5/30  bone]
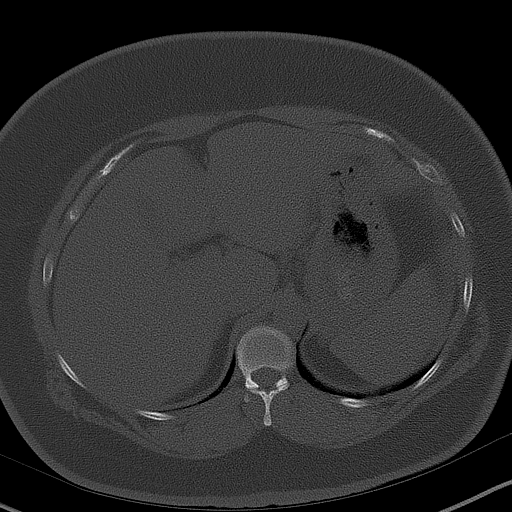
[im 10/30  soft-tissue]
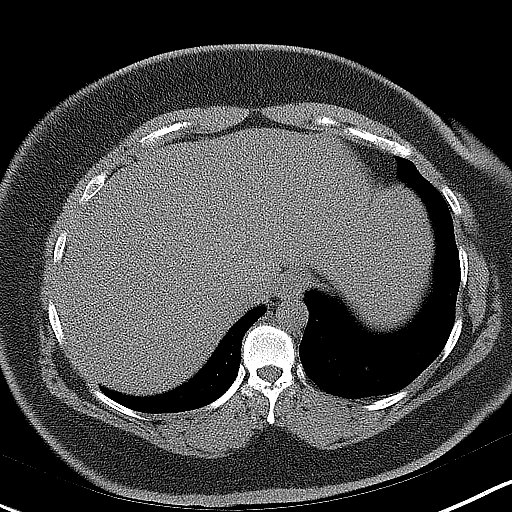
[im 10/30  lung]
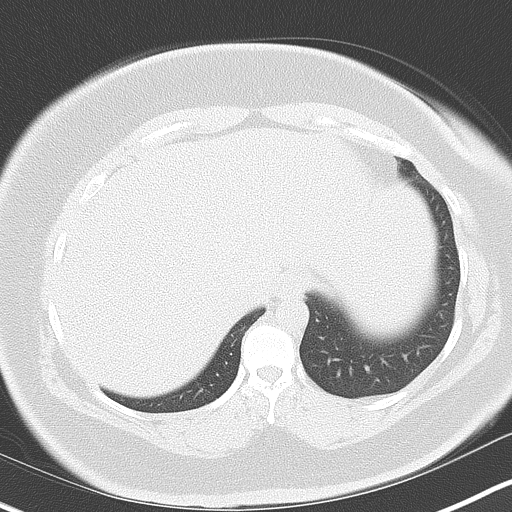
[im 15/30  soft-tissue]
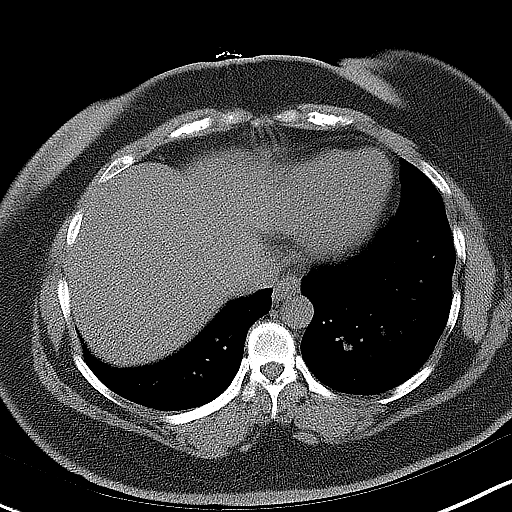
[im 15/30  lung]
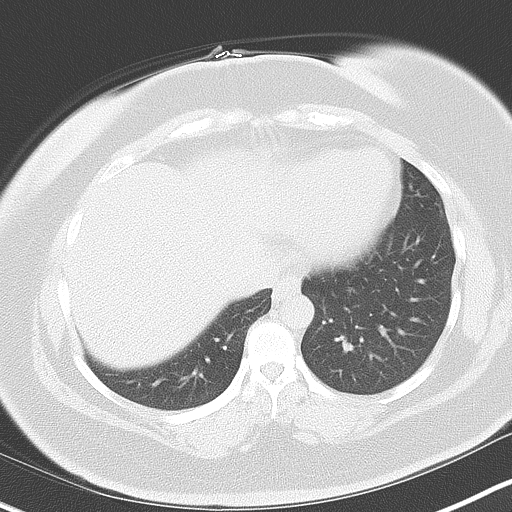
[im 20/30  soft-tissue]
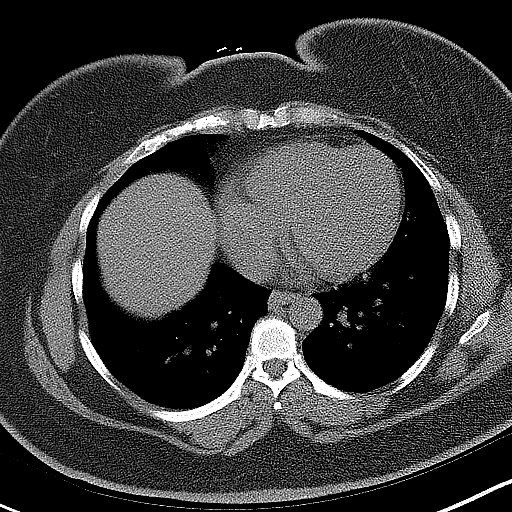
[im 20/30  lung]
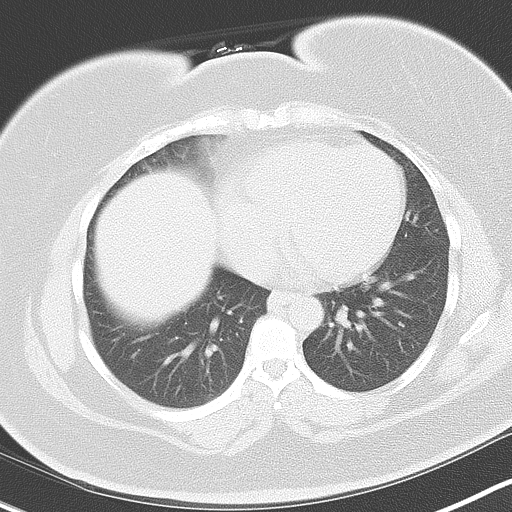
[im 25/30  soft-tissue]
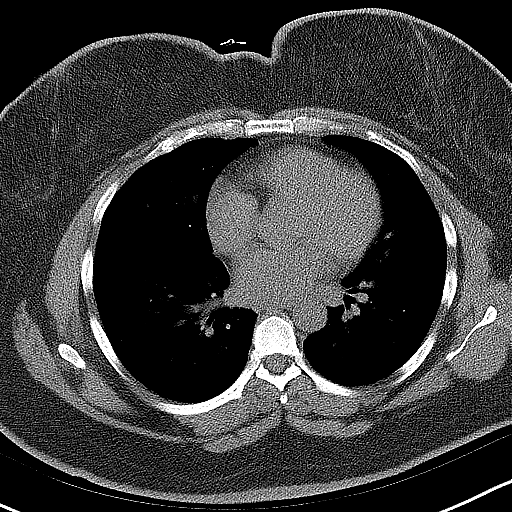
[im 25/30  lung]
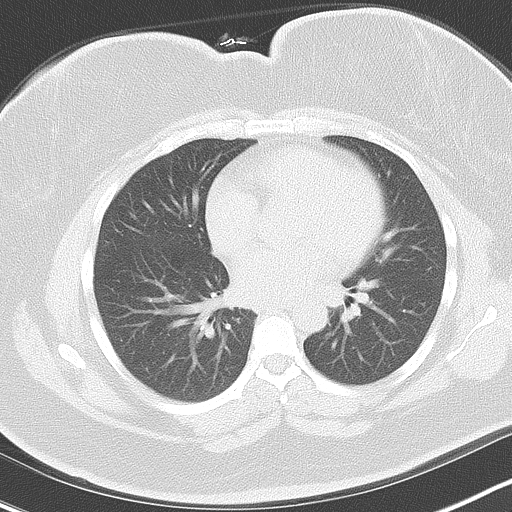

[Series 5: coronal · coronal · 0.85mm/px · 3 of 160 slices shown, 4 images]
[im 54/160  soft-tissue]
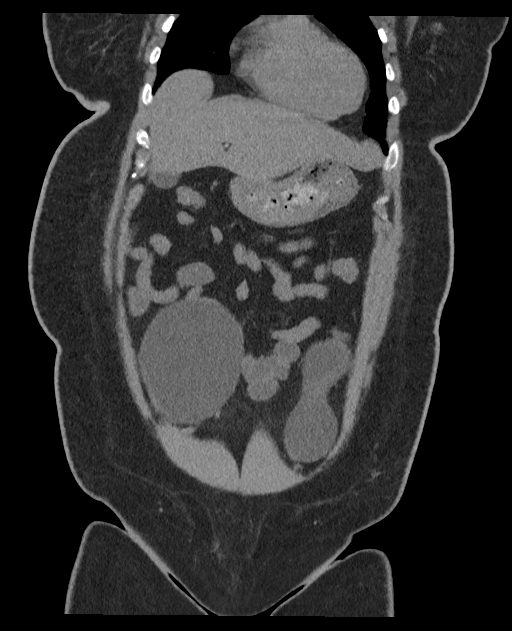
[im 71/160  soft-tissue]
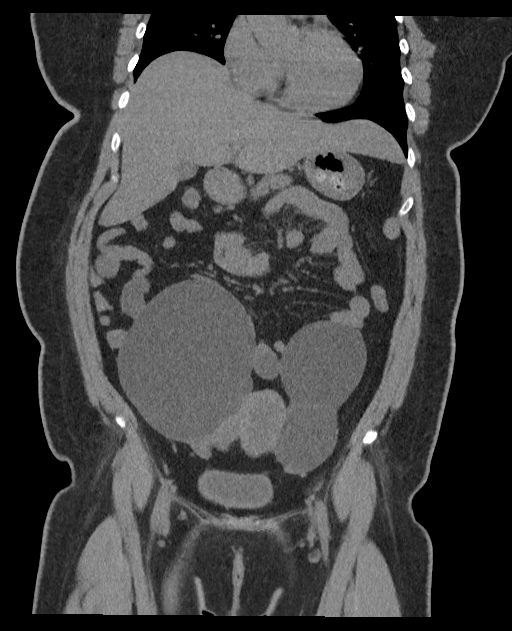
[im 71/160  bone]
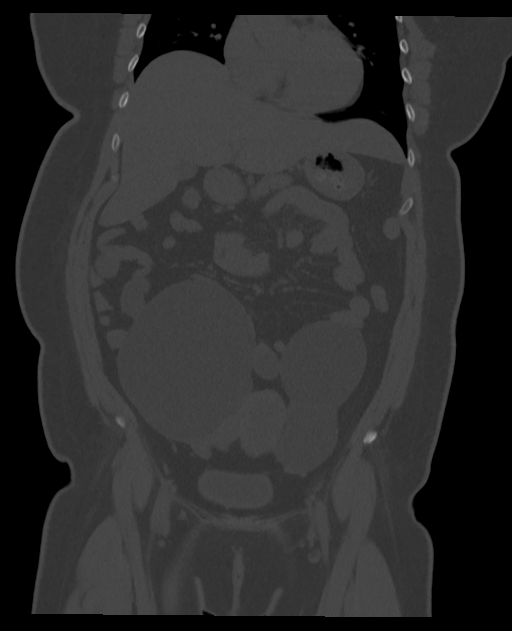
[im 89/160  soft-tissue]
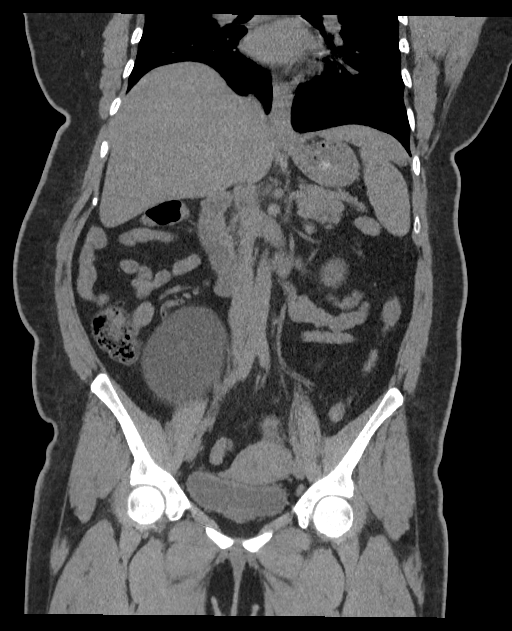

[Series 6: sagittal · sagittal · 0.62mm/px · 1 of 191 slices shown, 2 images]
[im 64/191  soft-tissue]
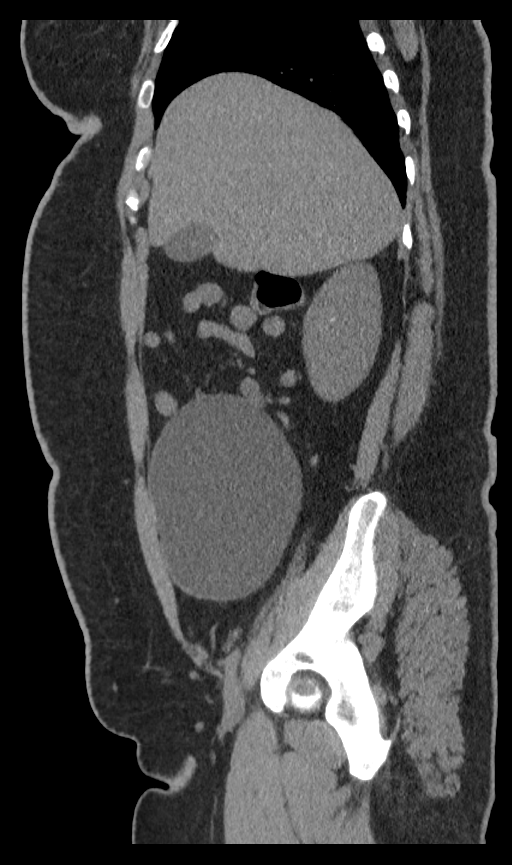
[im 64/191  bone]
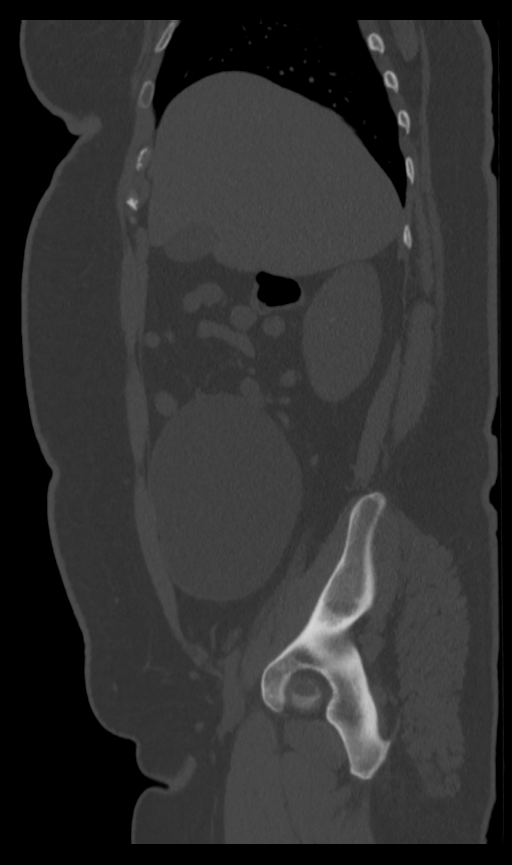

[9 of 46 positions shown; findings below may reference images not displayed]

FINDINGS: Lower chest:  Calcified granuloma at the right middle lobe.

Hepatobiliary: Hepatic steatosis with subtle fat sparing at the
gallbladder fossa.No evidence of biliary obstruction or stone.

Pancreas: Unremarkable.

Spleen: Unremarkable.

Adrenals/Urinary Tract: Negative adrenals. No hydronephrosis or
stone. Unremarkable bladder.

Stomach/Bowel:  No obstruction. No appendicitis.

Vascular/Lymphatic: No acute vascular abnormality. No mass or
adenopathy.

Reproductive:Large appearance of both ovaries, but greater on the
left with a low-density appearance and measuring 7.2 cm. There is
septated or multiple cysts on the left and a 10 cm by 13 cm cyst on
the right. The vascular pedicle on the left appears somewhat
redundant, findings overall worrisome for torsion.

Other: No ascites or pneumoperitoneum.

Musculoskeletal: No acute abnormalities.

These results were called by telephone at the time of interpretation
on 06/27/2019 at [DATE] to provider SHALLEY JIM , who verbally
acknowledged these results.
IMPRESSION: 1. Low-density thickening of the left ovary with distorted vascular
pedicle and septated or multiple cysts/. Recommend pelvic ultrasound
with Doppler to evaluate for torsion, the leading concern.
2. 13 cm right ovarian cyst that also needs sonographic assessment.
3. [place] steatosis.
